# Patient Record
Sex: Male | Born: 1995 | Race: Black or African American | Hispanic: No | Marital: Single | Smoking: Never smoker
Health system: Southern US, Community
[De-identification: ages and names within clinical notes are randomized; demographics above are authoritative.]

## PROBLEM LIST (undated history)

## (undated) DIAGNOSIS — R569 Unspecified convulsions: Secondary | ICD-10-CM

## (undated) HISTORY — PX: ARTHROSCOPY WITH ANTERIOR CRUCIATE LIGAMENT (ACL) REPAIR WITH ANTERIOR TIBILIAS GRAFT: SHX6503

---
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
## 2019-01-15 ENCOUNTER — Other Ambulatory Visit: Payer: Self-pay

## 2019-01-15 ENCOUNTER — Encounter: Payer: Self-pay | Admitting: Emergency Medicine

## 2019-01-15 ENCOUNTER — Emergency Department
Admission: EM | Admit: 2019-01-15 | Discharge: 2019-01-15 | Disposition: A | Discharge status: Home / Self Care | Payer: Medicaid Other | Attending: Emergency Medicine | Admitting: Emergency Medicine

## 2019-01-15 DIAGNOSIS — Z79899 Other long term (current) drug therapy: Secondary | ICD-10-CM | POA: Insufficient documentation

## 2019-01-15 DIAGNOSIS — H5789 Other specified disorders of eye and adnexa: Secondary | ICD-10-CM | POA: Diagnosis present

## 2019-01-15 DIAGNOSIS — L03211 Cellulitis of face: Secondary | ICD-10-CM | POA: Insufficient documentation

## 2019-01-15 DIAGNOSIS — L700 Acne vulgaris: Secondary | ICD-10-CM | POA: Insufficient documentation

## 2019-01-15 MED ORDER — SULFAMETHOXAZOLE-TRIMETHOPRIM 800-160 MG PO TABS: 1.0000 | ORAL_TABLET | Freq: Two times a day (BID) | ORAL | 0 refills | Status: AC

## 2019-01-15 MED ORDER — SULFAMETHOXAZOLE-TRIMETHOPRIM 800-160 MG PO TABS
1.0000 | ORAL_TABLET | Freq: Once | ORAL | Status: AC
  Administered 2019-01-15: 18:00:00 1 via ORAL
  Filled 2019-01-15: qty 1

## 2019-01-15 NOTE — Discharge Instructions (Signed)
You are being treated for cellulitis to the face due to underlying cystic acne. Take the antibiotic as directed.  Keep the skin clean and dry. Follow-up with your primary provider or Dermatologist as discussed.

## 2019-01-15 NOTE — ED Provider Notes (Signed)
[facility] Emergency Department Provider Note ____________________________________________  Time seen: 1745  I have reviewed the triage vital signs and the nursing notes.  HISTORY  Chief Complaint  Eye Pain  HPI Jamie Joseph is a 23 y.o. male presents to the ED for evaluation of swelling around his right eye.  Patient denies any visual disturbance, eye drainage, itching, nausea, vomiting, or dizziness.   He also denies any direct eye trauma, fever, chills, sweats. 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 He is currently on minocycline and retin-A topical. He awoke from a nap this afternoon with swelling around the right eye. He denies any pain or skin irritation. He notes similar swelling to the right jawline, associated with cystic acne lesion in the beard.   Past Medical History:  Diagnosis Date  . Seizures (HCC)     There are no active problems to display for this patient.   Past Surgical History:  Procedure Laterality Date  . ARTHROSCOPY WITH ANTERIOR CRUCIATE LIGAMENT (ACL) REPAIR WITH ANTERIOR TIBILIAS GRAFT      Prior to Admission medications   Medication Sig Start Date End Date Taking? Authorizing Provider  minocycline (MINOCIN) 100 MG capsule Take 100 mg by mouth 2 (two) times daily.   Yes [provider]  tretinoin (RETIN-A) 0.025 % cream Apply 1 application topically at bedtime.   Yes [provider]  sulfamethoxazole-trimethoprim (BACTRIM DS) 800-160 MG tablet Take 1 tablet by mouth 2 (two) times daily. 01/15/19   Kasheem Toner, Charlesetta Ivory, PA-C    Allergies Penicillins and Percocet [oxycodone-acetaminophen]  No family history on file.  Social History Social History   Tobacco Use  . Smoking status: Never Smoker  Substance Use Topics  . Alcohol use: No  . Drug use: Not on file    Review of Systems  Constitutional: Negative for fever. Eyes: Negative for visual changes. Eye  swelling as above.  ENT: Negative for sore throat. Cardiovascular: Negative for chest pain. Respiratory: Negative for shortness of breath. Gastrointestinal: Negative for abdominal pain, vomiting and diarrhea. Musculoskeletal: Negative for back pain. Skin: Negative for rash. Neurological: Negative for headaches, focal weakness or numbness. ____________________________________________  PHYSICAL EXAM:  VITAL SIGNS: ED Triage Vitals  Enc Vitals Group     BP 01/15/19 1702 122/66     Pulse Rate 01/15/19 1702 71     Resp 01/15/19 1702 20     Temp 01/15/19 1702 98.5 F (36.9 C)     Temp Source 01/15/19 1702 Oral     SpO2 01/15/19 1702 100 %     Weight 01/15/19 1659 125 lb (56.7 kg)     Height 01/15/19 1659 5\' 5"  (1.651 m)     Head Circumference --      Peak Flow --      Pain Score 01/15/19 1659 0     Pain Loc --      Pain Edu? --      Excl. in GC? --     Constitutional: Alert and oriented. Well appearing and in no distress. Head: Normocephalic and atraumatic. Eyes: Conjunctivae are normal. PERRL. Normal extraocular movements. There is some subtle edema to the supra- and infraorbital region on the right. There is a large focal cystic lesion with overlying pustule to the right temple.  Ears: Canals clear. TMs intact bilaterally. Neck: Supple. Normal ROM Hematological/Lymphatic/Immunological: No cervical lymphadenopathy. Cardiovascular: Normal rate, regular rhythm. Normal distal pulses. Respiratory: Normal respiratory effort. No wheezes/rales/rhonchi. Musculoskeletal: Nontender  with normal range of motion in all extremities.  Neurologic:  Normal gait without ataxia. Normal speech and language. No gross focal neurologic deficits are appreciated. Skin:  Skin is warm, dry and intact. No rash noted. Patient with moderate nodulocystic acne over the forehead and concentrated in the beard and neckline.  ____________________________________________  PROCEDURES  Bactrim DS 1  PO Procedures ____________________________________________  INITIAL IMPRESSION / ASSESSMENT AND PLAN / ED COURSE  Patient with ED evaluation of resolving swelling around the right eye. His exam is otherwise stable, he is without signs of toxicity or systemic sepsis. He is likely experiencing local STS due to and inflammatory cystic acne lesion to the right temple. We will treat with Bactrim DS and he will continue with his home meds. He will follow-up with his PCP or Dermatologist as scheduled. Return precautions are reviewed.   Jamie Joseph was evaluated in Emergency Department on 01/15/2019 for the symptoms described in the history of present illness. He was evaluated in the context of the global COVID-19 pandemic, which necessitated consideration that the patient might be at risk for infection with the SARS-CoV-2 virus that causes COVID-19. Institutional protocols and algorithms that pertain to the evaluation of patients at risk for COVID-19 are in a state of rapid change based on information released by regulatory bodies including the CDC and federal and state organizations. These policies and algorithms were followed during the patient's care in the ED. ____________________________________________  FINAL CLINICAL IMPRESSION(S) / ED DIAGNOSES  Final diagnoses:  Cellulitis of face  Cystic acne      Carmie End, Dannielle Karvonen, PA-C 01/15/19 1837    Nance Pear, MD 01/15/19 2008

## 2019-01-15 NOTE — ED Triage Notes (Signed)
Pt reports today started with some swelling around his right eye. Pt denies discharge or itching or injuries. Denies vision problems.

## 2019-06-08 ENCOUNTER — Ambulatory Visit: Payer: Medicaid Other | Attending: Internal Medicine

## 2019-06-08 DIAGNOSIS — Z23 Encounter for immunization: Secondary | ICD-10-CM

## 2019-06-08 NOTE — Progress Notes (Signed)
   Covid-19 Vaccination Clinic  Name:  Jamie Joseph    MRN: [id number] DOB: [birth year]  06/08/2019  Mr. [name] was observed post Covid-19 immunization for 15 minutes without incident. He was provided with Vaccine Information Sheet and instruction to access the V-Safe system.   Mr. [name] was instructed to call 911 with any severe reactions post vaccine: Marland Kitchen Difficulty breathing  . Swelling of face and throat  . A fast heartbeat  . A bad rash all over body  . Dizziness and weakness   Immunizations Administered    Name Date Dose VIS Date Route   Pfizer COVID-19 Vaccine 06/08/2019  8:50 AM 0.3 mL 02/08/2019 Intramuscular   Manufacturer: [employer], [employer]   Lot: [id number]   NDC: [address]-2

## 2019-07-02 ENCOUNTER — Ambulatory Visit: Payer: Medicaid Other | Attending: Internal Medicine

## 2019-07-02 DIAGNOSIS — Z23 Encounter for immunization: Secondary | ICD-10-CM

## 2019-07-02 NOTE — Progress Notes (Signed)
   Covid-19 Vaccination Clinic  Name:  Jamie Joseph    MRN: [id number] DOB: June 26, 1995  07/02/2019  [name] was observed post Covid-19 immunization for 15 minutes without incident. He was provided with Vaccine Information Sheet and instruction to access the V-Safe system.   [name] was instructed to call 911 with any severe reactions post vaccine: Marland Kitchen Difficulty breathing  . Swelling of face and throat  . A fast heartbeat  . A bad rash all over body  . Dizziness and weakness   Immunizations Administered    Name Date Dose VIS Date Route   Pfizer COVID-19 Vaccine 07/02/2019  4:52 PM 0.3 mL 04/24/2018 Intramuscular   Manufacturer: [employer], [employer]   Lot: [id number]   NDC: [address]-3
